# Patient Record
Sex: Male | Born: 1960 | Race: White | Hispanic: No | Marital: Married | Smoking: Never smoker
Health system: Southern US, Community
[De-identification: ages and names within clinical notes are randomized; demographics above are authoritative.]

## PROBLEM LIST (undated history)

## (undated) DIAGNOSIS — C801 Malignant (primary) neoplasm, unspecified: Secondary | ICD-10-CM

---

## 2015-05-19 ENCOUNTER — Encounter (HOSPITAL_COMMUNITY): Payer: Self-pay | Admitting: Emergency Medicine

## 2015-05-19 ENCOUNTER — Emergency Department (HOSPITAL_COMMUNITY)
Admission: EM | Admit: 2015-05-19 | Discharge: 2015-05-19 | Disposition: A | Discharge status: Home / Self Care | Payer: Managed Care, Other (non HMO) | Attending: Emergency Medicine | Admitting: Emergency Medicine

## 2015-05-19 ENCOUNTER — Emergency Department (HOSPITAL_COMMUNITY): Payer: Managed Care, Other (non HMO)

## 2015-05-19 DIAGNOSIS — I10 Essential (primary) hypertension: Secondary | ICD-10-CM | POA: Diagnosis not present

## 2015-05-19 DIAGNOSIS — Z7982 Long term (current) use of aspirin: Secondary | ICD-10-CM | POA: Diagnosis not present

## 2015-05-19 DIAGNOSIS — R0602 Shortness of breath: Secondary | ICD-10-CM | POA: Diagnosis present

## 2015-05-19 DIAGNOSIS — Z880 Allergy status to penicillin: Secondary | ICD-10-CM | POA: Diagnosis not present

## 2015-05-19 DIAGNOSIS — R070 Pain in throat: Secondary | ICD-10-CM | POA: Diagnosis not present

## 2015-05-19 DIAGNOSIS — Z79899 Other long term (current) drug therapy: Secondary | ICD-10-CM | POA: Diagnosis not present

## 2015-05-19 DIAGNOSIS — R079 Chest pain, unspecified: Secondary | ICD-10-CM

## 2015-05-19 DIAGNOSIS — Z8546 Personal history of malignant neoplasm of prostate: Secondary | ICD-10-CM | POA: Insufficient documentation

## 2015-05-19 DIAGNOSIS — R0982 Postnasal drip: Secondary | ICD-10-CM | POA: Insufficient documentation

## 2015-05-19 DIAGNOSIS — E669 Obesity, unspecified: Secondary | ICD-10-CM | POA: Diagnosis not present

## 2015-05-19 DIAGNOSIS — I447 Left bundle-branch block, unspecified: Secondary | ICD-10-CM | POA: Diagnosis not present

## 2015-05-19 HISTORY — DX: Malignant (primary) neoplasm, unspecified: C80.1

## 2015-05-19 LAB — I-STAT TROPONIN, ED: Troponin i, poc: 0.01 ng/mL (ref 0.00–0.08)

## 2015-05-19 LAB — CBC WITH DIFFERENTIAL/PLATELET
BASOS ABS: 0.1 10*3/uL (ref 0.0–0.1)
Basophils Relative: 1 %
EOS ABS: 0.2 10*3/uL (ref 0.0–0.7)
EOS PCT: 4 %
HCT: 39.2 % (ref 39.0–52.0)
Hemoglobin: 13.8 g/dL (ref 13.0–17.0)
Lymphocytes Relative: 22 %
Lymphs Abs: 1.1 10*3/uL (ref 0.7–4.0)
MCH: 31.7 pg (ref 26.0–34.0)
MCHC: 35.2 g/dL (ref 30.0–36.0)
MCV: 89.9 fL (ref 78.0–100.0)
Monocytes Absolute: 0.5 10*3/uL (ref 0.1–1.0)
Monocytes Relative: 11 %
Neutro Abs: 3.1 10*3/uL (ref 1.7–7.7)
Neutrophils Relative %: 62 %
PLATELETS: 161 10*3/uL (ref 150–400)
RBC: 4.36 MIL/uL (ref 4.22–5.81)
RDW: 13.4 % (ref 11.5–15.5)
WBC: 5 10*3/uL (ref 4.0–10.5)

## 2015-05-19 LAB — BASIC METABOLIC PANEL
ANION GAP: 10 (ref 5–15)
BUN: 12 mg/dL (ref 6–20)
CO2: 25 mmol/L (ref 22–32)
Calcium: 9.3 mg/dL (ref 8.9–10.3)
Chloride: 105 mmol/L (ref 101–111)
Creatinine, Ser: 0.9 mg/dL (ref 0.61–1.24)
Glucose, Bld: 172 mg/dL — ABNORMAL HIGH (ref 65–99)
POTASSIUM: 3.4 mmol/L — AB (ref 3.5–5.1)
SODIUM: 140 mmol/L (ref 135–145)

## 2015-05-19 MED ORDER — CETIRIZINE HCL 10 MG PO TABS: 10.0000 mg | ORAL_TABLET | Freq: Every day | ORAL | Status: AC

## 2015-05-19 NOTE — ED Provider Notes (Signed)
CSN: [id number]     Arrival date & time 05/19/15  [id number] History   First MD Initiated Contact with Patient 05/19/15 [phone]     Chief Complaint  Patient presents with  . Shortness of Breath      HPI  Impression presents for evaluation of nasal congestion and tightness in his throat.  He lives in [place]. He works Architect. He is in [place] working at the [employer]. He states routinely during the day he is up and down 2-4 flights of stairs and carrying a pack of his stools. He does notice that he gets short of breath and doing this. His also has some nasal congestion over the last 2 days this seems to improve with his morning Flonase. At times he will feel tightness in his throat. He states he is not having classic anginal type chest pain.  No edema. No history of DVT or PE. No fevers. Infrequent cough.  Is a lifetime nonsmoker. History of hypertension on a RV, father had heart attack at 63. Heart score is 3  Past Medical History  Diagnosis Date  . Cancer [facility])     Prostatectomy   History reviewed. No pertinent past surgical history. History reviewed. No pertinent family history. Social History  Substance Use Topics  . Smoking status: Never Smoker   . Smokeless tobacco: None  . Alcohol Use: Yes     Comment: 2 drinks/day    Review of Systems  Constitutional: Negative for fever, chills, diaphoresis, appetite change and fatigue.  HENT: Positive for postnasal drip. Negative for mouth sores, sore throat and trouble swallowing.   Eyes: Negative for visual disturbance.  Respiratory: Positive for shortness of breath. Negative for cough, chest tightness and wheezing.        Sensation of tightness in his throat.  Cardiovascular: Negative for chest pain.  Gastrointestinal: Negative for nausea, vomiting, abdominal pain, diarrhea and abdominal distention.  Endocrine: Negative for polydipsia, polyphagia and polyuria.  Genitourinary: Negative for dysuria, frequency and hematuria.   Musculoskeletal: Negative for gait problem.  Skin: Negative for color change, pallor and rash.  Neurological: Negative for dizziness, syncope, light-headedness and headaches.  Hematological: Does not bruise/bleed easily.  Psychiatric/Behavioral: Negative for behavioral problems and confusion.      Allergies  Mold extract and Penicillins  Home Medications   Prior to Admission medications   Medication Sig Start Date End Date Taking? Authorizing Provider  aspirin 325 MG tablet Take 975 mg by mouth daily.   Yes Historical Provider, MD  cetirizine (ZYRTEC) 10 MG tablet Take 10 mg by mouth daily.   Yes Historical Provider, MD  fluticasone (FLONASE) 50 MCG/ACT nasal spray Place 1 spray into both nostrils daily as needed.   Yes Historical Provider, MD  losartan-hydrochlorothiazide (HYZAAR) 100-25 MG tablet Take 1 tablet by mouth daily.   Yes Historical Provider, MD   BP 119/56 mmHg  Pulse 62  Resp 17  Ht 6\' 2"  (1.88 m)  Wt 332 lb 12.8 oz (150.957 kg)  BMI 42.71 kg/m2  SpO2 92% Physical Exam  Constitutional: He is oriented to person, place, and time. He appears well-developed and well-nourished. No distress.  Large stature/obese male. No dyspnea with conversation. Awake and alert.  HENT:  Head: Normocephalic.  No erythema or abnormal is the posterior pharynx or nares.  Eyes: Conjunctivae are normal. Pupils are equal, round, and reactive to light. No scleral icterus.  Neck: Normal range of motion. Neck supple. No thyromegaly present.  Cardiovascular: Normal rate and regular rhythm.  Exam reveals no gallop and no friction rub.   No murmur heard. Pulmonary/Chest: Effort normal and breath sounds normal. No respiratory distress. He has no wheezes. He has no rales.  Clear bilateral breath sounds. No wheezing rales or prolongation.  Abdominal: Soft. Bowel sounds are normal. He exhibits no distension. There is no tenderness. There is no rebound.  Musculoskeletal: Normal range of motion.   Neurological: He is alert and oriented to person, place, and time.  Skin: Skin is warm and dry. No rash noted.  Psychiatric: He has a normal mood and affect. His behavior is normal.    ED Course  Procedures (including critical care time) Labs Review Labs Reviewed  BASIC METABOLIC PANEL - Abnormal; Notable for the following:    Potassium 3.4 (*)    Glucose, Bld 172 (*)    All other components within normal limits  CBC WITH DIFFERENTIAL/PLATELET  Randolm Idol, ED    Imaging Review Dg Chest 2 View  05/19/2015  CLINICAL DATA:  Shortness of breath and chest discomfort for a few days. Initial encounter. EXAM: CHEST  2 VIEW COMPARISON:  None. FINDINGS: The lungs are clear. Heart size is upper normal. No pneumothorax or pleural effusion. No focal bony abnormality. IMPRESSION: No acute disease. Electronically Signed   By: Inge Rise M.D.   On: 05/19/2015 11:01   I have personally reviewed and evaluated these images and lab results as part of my medical decision-making.   EKG Interpretation   Date/Time:  Tuesday May 19 2015 09:54:19 EST Ventricular Rate:  69 PR Interval:  166 QRS Duration: 131 QT Interval:  400 QTC Calculation: 428 R Axis:   41 Text Interpretation:  Sinus rhythm IVCD, consider atypical LBBB Confirmed  by Jeneen Rinks  MD, [place] ([id number]) on 05/19/2015 10:21:52 AM      MDM   Final diagnoses:  Chest pain, unspecified chest pain type  Throat pain  Post-nasal drip  Left bundle branch block    EKG with a bundle-branch block. He is never had an EKG before does not know if this is new or old. His symptoms sound more like deconditioning. We'll plan serial troponins. Reevaluation.  Troponin normal after symptoms majority of the day yesterday and this morning. Reassuring labs. I discussed with him the importance of following up with a cardiologist. I cannot tell him with certainty that he is not having angina with exertion. However it takes marked exertion  reproduces symptoms. He is returning home to [place] in the morning. He does not have strenuous activity today. Continue his aspirin. Zyrtec for his postnasal drip. Continue Flonase. Cranial does follow-up. Return here with recurrence or worsening in the interval    Tanna Furry, MD 05/19/15 1235

## 2015-05-19 NOTE — Discharge Instructions (Signed)
Continue taking your aspirin daily. Zyrtec daily Continue Flonase Follow up with your primary care physician for a cardiology referral tomorrow when you return home. Avoid strenuous activity that provokes symptoms until you can see your physician/cardiologist. If you develop symptoms that worsen or persist please check here.   Aspirin and Your Heart  Aspirin is a medicine that affects the way blood clots. Aspirin can be used to help reduce the risk of blood clots, heart attacks, and other heart-related problems.  SHOULD I TAKE ASPIRIN? Your health care provider will help you determine whether it is safe and beneficial for you to take aspirin daily. Taking aspirin daily may be beneficial if you:  Have had a heart attack or chest pain.  Have undergone open heart surgery such as coronary artery bypass surgery (CABG).  Have had coronary angioplasty.  Have experienced a stroke or transient ischemic attack (TIA).  Have peripheral vascular disease (PVD).  Have chronic heart rhythm problems such as atrial fibrillation. ARE THERE ANY RISKS OF TAKING ASPIRIN DAILY? Daily use of aspirin can increase your risk of side effects. Some of these include:  Bleeding. Bleeding problems can be minor or serious. An example of a minor problem is a cut that does not stop bleeding. An example of a more serious problem is stomach bleeding or bleeding into the brain. Your risk of bleeding is increased if you are also taking non-steroidal anti-inflammatory medicine (NSAIDs).  Increased bruising.  Upset stomach.  An allergic reaction. People who have nasal polyps have an increased risk of developing an aspirin allergy. WHAT ARE SOME GUIDELINES I SHOULD FOLLOW WHEN TAKING ASPIRIN?   Take aspirin only as directed by your health care provider. Make sure you understand how much you should take and what form you should take. The two forms of aspirin are:  Non-enteric-coated. This type of aspirin does not have a  coating and is absorbed quickly. Non-enteric-coated aspirin is usually recommended for people with chest pain. This type of aspirin also comes in a chewable form.  Enteric-coated. This type of aspirin has a special coating that releases the medicine very slowly. Enteric-coated aspirin causes less stomach upset than non-enteric-coated aspirin. This type of aspirin should not be chewed or crushed.  Drink alcohol in moderation. Drinking alcohol increases your risk of bleeding. WHEN SHOULD I SEEK MEDICAL CARE?   You have unusual bleeding or bruising.  You have stomach pain.  You have an allergic reaction. Symptoms of an allergic reaction include:  Hives.  Itchy skin.  Swelling of the lips, tongue, or face.  You have ringing in your ears. WHEN SHOULD I SEEK IMMEDIATE MEDICAL CARE?   Your bowel movements are bloody, dark red, or black in color.  You vomit or cough up blood.  You have blood in your urine.  You cough, wheeze, or feel short of breath. If you have any of the following symptoms, this is an emergency. Do not wait to see if the pain will go away. Get medical help at once. Call your local emergency services (911 in the [place] Do not drive yourself to the hospital.  You have severe chest pain, especially if the pain is crushing or pressure-like and spreads to the arms, back, neck, or jaw.  You have stroke-like symptoms, such as:   Loss of vision.   Difficulty talking.   Numbness or weakness on one side of your body.   Numbness or weakness in your arm or leg.   Not thinking clearly or feeling  confused.    This information is not intended to replace advice given to you by your health care provider. Make sure you discuss any questions you have with your health care provider.   Document Released: 02/18/2008 Document Revised: 03/28/2014 Document Reviewed: 06/12/2013 Elsevier Interactive Patient Education [employer].

## 2015-05-19 NOTE — ED Notes (Signed)
Pt c/o SOB starting last night, post-nasal drip and dry and inflamed throat. Denies cp, but breathing harder while sitting up.

## 2017-08-22 IMAGING — CR DG CHEST 2V
2 series · 2 of 2 positions shown · non-contrast
Comparison: None.

CLINICAL DATA: Shortness of breath and chest discomfort for a few
days. Initial encounter.

EXAM:
CHEST  2 VIEW

[chest pa]
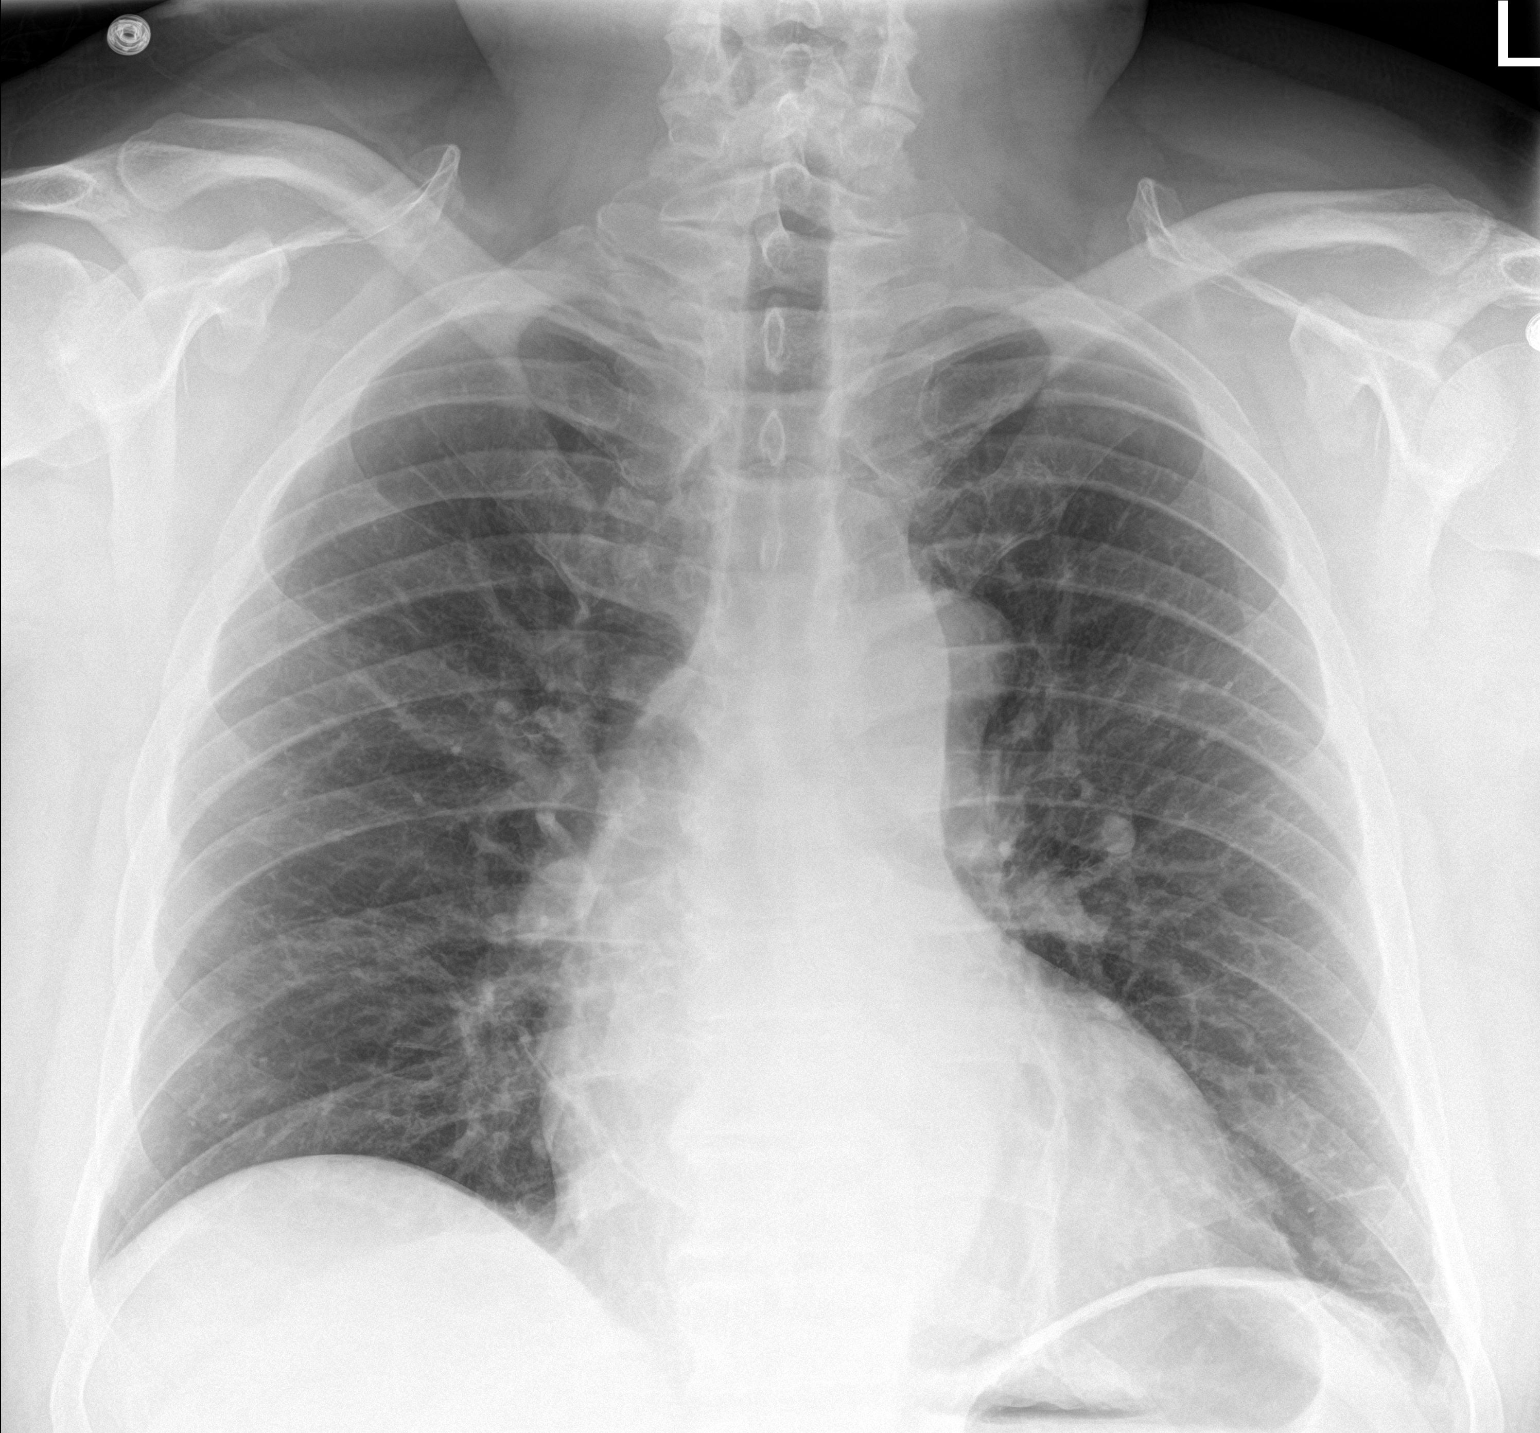

[chest lat]
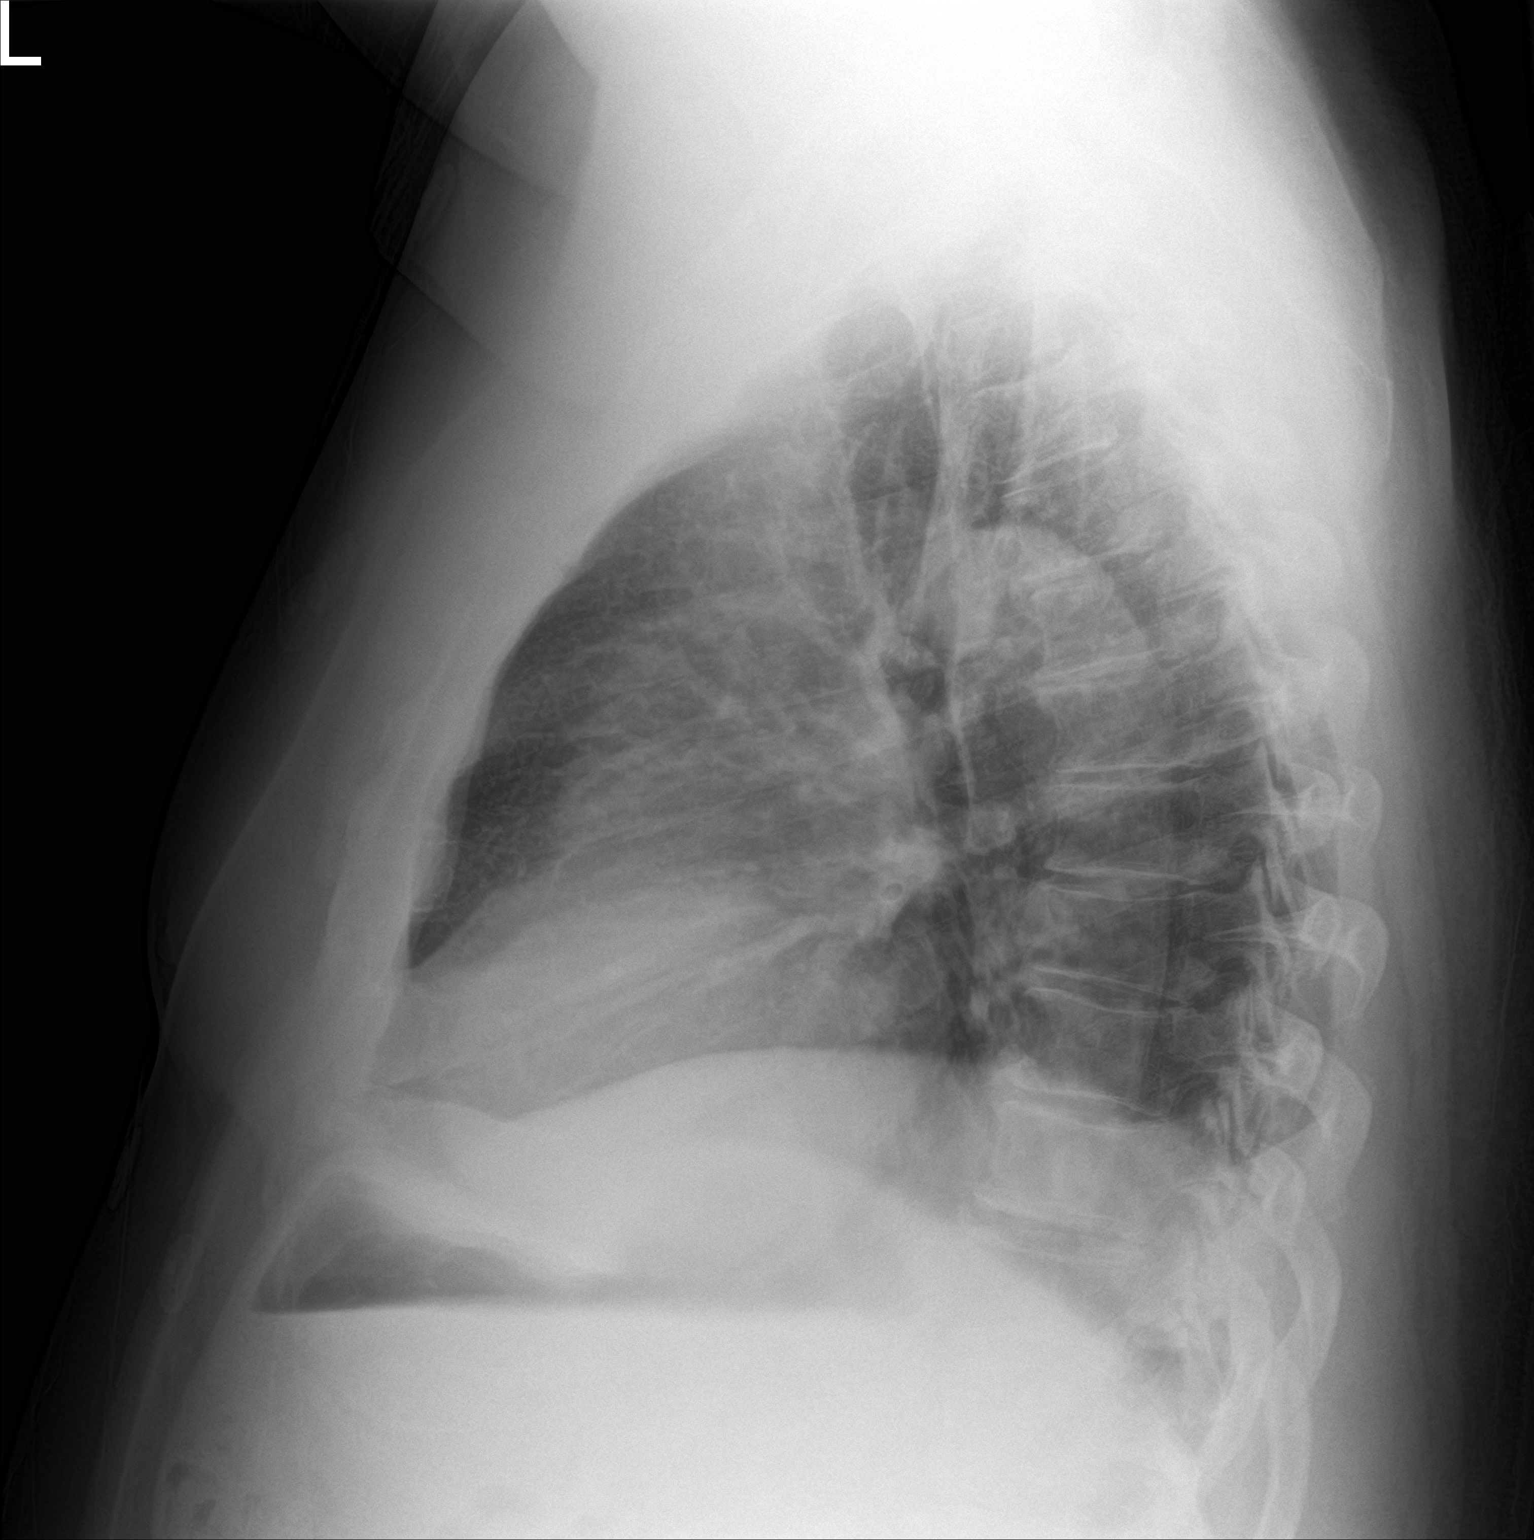

[2 of 2 positions shown; findings below may reference images not displayed]

FINDINGS: The lungs are clear. Heart size is upper normal. No pneumothorax or
pleural effusion. No focal bony abnormality.
IMPRESSION: No acute disease.
